# Patient Record
Sex: Female | Born: 1975 | Race: Black or African American | Hispanic: No | Smoking: Never smoker
Health system: Southern US, Community
[De-identification: ages and names within clinical notes are randomized; demographics above are authoritative.]

## PROBLEM LIST (undated history)

## (undated) DIAGNOSIS — O0930 Supervision of pregnancy with insufficient antenatal care, unspecified trimester: Secondary | ICD-10-CM

## (undated) DIAGNOSIS — A599 Trichomoniasis, unspecified: Secondary | ICD-10-CM

## (undated) DIAGNOSIS — D649 Anemia, unspecified: Secondary | ICD-10-CM

## (undated) DIAGNOSIS — O09529 Supervision of elderly multigravida, unspecified trimester: Secondary | ICD-10-CM

## (undated) DIAGNOSIS — A749 Chlamydial infection, unspecified: Secondary | ICD-10-CM

## (undated) DIAGNOSIS — D573 Sickle-cell trait: Secondary | ICD-10-CM

## (undated) HISTORY — PX: WISDOM TOOTH EXTRACTION: SHX21

---

## 1996-05-22 DIAGNOSIS — A599 Trichomoniasis, unspecified: Secondary | ICD-10-CM

## 1996-05-22 DIAGNOSIS — A749 Chlamydial infection, unspecified: Secondary | ICD-10-CM

## 1996-05-22 HISTORY — DX: Chlamydial infection, unspecified: A74.9

## 1996-05-22 HISTORY — DX: Trichomoniasis, unspecified: A59.9

## 2011-01-18 LAB — GC/CHLAMYDIA PROBE AMP, GENITAL: Gonorrhea: NEGATIVE

## 2011-01-18 LAB — RPR: RPR: NONREACTIVE

## 2011-01-18 LAB — ANTIBODY SCREEN: Antibody Screen: NEGATIVE

## 2011-05-09 LAB — STREP B DNA PROBE: GBS: NEGATIVE

## 2011-05-18 ENCOUNTER — Other Ambulatory Visit (HOSPITAL_COMMUNITY): Payer: Self-pay | Admitting: Obstetrics and Gynecology

## 2011-05-18 DIAGNOSIS — O409XX0 Polyhydramnios, unspecified trimester, not applicable or unspecified: Secondary | ICD-10-CM

## 2011-05-19 ENCOUNTER — Other Ambulatory Visit (HOSPITAL_COMMUNITY): Payer: Self-pay | Admitting: Obstetrics and Gynecology

## 2011-05-19 ENCOUNTER — Ambulatory Visit (HOSPITAL_COMMUNITY)
Admission: RE | Admit: 2011-05-19 | Discharge: 2011-05-19 | Disposition: A | Discharge status: Home / Self Care | Payer: Medicaid Other | Source: Ambulatory Visit | Attending: Obstetrics and Gynecology | Admitting: Obstetrics and Gynecology

## 2011-05-19 DIAGNOSIS — Z3689 Encounter for other specified antenatal screening: Secondary | ICD-10-CM | POA: Insufficient documentation

## 2011-05-19 DIAGNOSIS — O409XX0 Polyhydramnios, unspecified trimester, not applicable or unspecified: Secondary | ICD-10-CM

## 2011-05-22 ENCOUNTER — Inpatient Hospital Stay (HOSPITAL_COMMUNITY): Admission: AD | Admit: 2011-05-22 | Payer: Self-pay | Source: Ambulatory Visit | Admitting: Obstetrics and Gynecology

## 2011-06-11 ENCOUNTER — Encounter (HOSPITAL_COMMUNITY): Payer: Self-pay | Admitting: Pharmacist

## 2011-06-12 ENCOUNTER — Inpatient Hospital Stay (HOSPITAL_COMMUNITY)
Admission: RE | Admit: 2011-06-12 | Discharge: 2011-06-16 | DRG: 766 | Disposition: A | Discharge status: Home / Self Care | Payer: Medicaid Other | Source: Ambulatory Visit | Attending: Obstetrics and Gynecology | Admitting: Obstetrics and Gynecology

## 2011-06-12 ENCOUNTER — Encounter (HOSPITAL_COMMUNITY): Payer: Self-pay

## 2011-06-12 DIAGNOSIS — O409XX0 Polyhydramnios, unspecified trimester, not applicable or unspecified: Secondary | ICD-10-CM

## 2011-06-12 DIAGNOSIS — D649 Anemia, unspecified: Secondary | ICD-10-CM | POA: Diagnosis not present

## 2011-06-12 DIAGNOSIS — O0930 Supervision of pregnancy with insufficient antenatal care, unspecified trimester: Secondary | ICD-10-CM

## 2011-06-12 DIAGNOSIS — Z98891 History of uterine scar from previous surgery: Secondary | ICD-10-CM | POA: Diagnosis not present

## 2011-06-12 DIAGNOSIS — D573 Sickle-cell trait: Secondary | ICD-10-CM

## 2011-06-12 DIAGNOSIS — O9903 Anemia complicating the puerperium: Secondary | ICD-10-CM | POA: Diagnosis not present

## 2011-06-12 DIAGNOSIS — O09529 Supervision of elderly multigravida, unspecified trimester: Secondary | ICD-10-CM

## 2011-06-12 DIAGNOSIS — O480 Post-term pregnancy: Principal | ICD-10-CM | POA: Diagnosis present

## 2011-06-12 HISTORY — DX: Chlamydial infection, unspecified: A74.9

## 2011-06-12 HISTORY — DX: Supervision of pregnancy with insufficient antenatal care, unspecified trimester: O09.30

## 2011-06-12 HISTORY — DX: Sickle-cell trait: D57.3

## 2011-06-12 HISTORY — DX: Trichomoniasis, unspecified: A59.9

## 2011-06-12 HISTORY — DX: Anemia, unspecified: D64.9

## 2011-06-12 HISTORY — DX: Supervision of elderly multigravida, unspecified trimester: O09.529

## 2011-06-12 LAB — CBC
MCH: 29.3 pg (ref 26.0–34.0)
MCHC: 34.6 g/dL (ref 30.0–36.0)
RDW: 15.3 % (ref 11.5–15.5)

## 2011-06-12 MED ORDER — LACTATED RINGERS IV SOLN
INTRAVENOUS | Status: DC
  Administered 2011-06-12 – 2011-06-14 (×6): via INTRAVENOUS

## 2011-06-12 MED ORDER — OXYTOCIN BOLUS FROM INFUSION
500.0000 mL | Freq: Once | INTRAVENOUS | Status: DC
  Filled 2011-06-12: qty 500

## 2011-06-12 MED ORDER — LIDOCAINE HCL (PF) 1 % IJ SOLN
30.0000 mL | INTRAMUSCULAR | Status: DC | PRN
  Filled 2011-06-12: qty 30

## 2011-06-12 MED ORDER — ACETAMINOPHEN 325 MG PO TABS: 650.0000 mg | ORAL_TABLET | ORAL | Status: DC | PRN

## 2011-06-12 MED ORDER — ZOLPIDEM TARTRATE 10 MG PO TABS
10.0000 mg | ORAL_TABLET | Freq: Every evening | ORAL | Status: DC | PRN
  Administered 2011-06-13: 10 mg via ORAL
  Filled 2011-06-12: qty 1

## 2011-06-12 MED ORDER — CITRIC ACID-SODIUM CITRATE 334-500 MG/5ML PO SOLN
30.0000 mL | ORAL | Status: DC | PRN
  Filled 2011-06-12: qty 15

## 2011-06-12 MED ORDER — TERBUTALINE SULFATE 1 MG/ML IJ SOLN: 0.2500 mg | Freq: Once | INTRAMUSCULAR | Status: AC | PRN

## 2011-06-12 MED ORDER — DINOPROSTONE 10 MG VA INST
10.0000 mg | VAGINAL_INSERT | Freq: Once | VAGINAL | Status: AC
  Administered 2011-06-12: 10 mg via VAGINAL
  Filled 2011-06-12: qty 1

## 2011-06-12 MED ORDER — ONDANSETRON HCL 4 MG/2ML IJ SOLN
4.0000 mg | Freq: Four times a day (QID) | INTRAMUSCULAR | Status: DC | PRN
  Administered 2011-06-13: 4 mg via INTRAVENOUS
  Filled 2011-06-12: qty 2

## 2011-06-12 MED ORDER — IBUPROFEN 600 MG PO TABS: 600.0000 mg | ORAL_TABLET | Freq: Four times a day (QID) | ORAL | Status: DC | PRN

## 2011-06-12 MED ORDER — LACTATED RINGERS IV SOLN
500.0000 mL | INTRAVENOUS | Status: DC | PRN
  Administered 2011-06-12 – 2011-06-13 (×2): 1000 mL via INTRAVENOUS

## 2011-06-12 MED ORDER — OXYTOCIN 10 UNIT/ML IJ SOLN: 10.0000 [IU] | Freq: Once | INTRAMUSCULAR | Status: DC

## 2011-06-12 MED ORDER — OXYTOCIN 20 UNITS IN LACTATED RINGERS INFUSION - SIMPLE: 125.0000 mL/h | Freq: Once | INTRAVENOUS | Status: DC

## 2011-06-12 MED ORDER — OXYCODONE-ACETAMINOPHEN 5-325 MG PO TABS: 2.0000 | ORAL_TABLET | ORAL | Status: DC | PRN

## 2011-06-12 NOTE — H&P (Signed)
Sherry Walls is a 36 y.o. female presenting for IOL secondary to post-dates. Denies ctx, VB or LOF, reports +FM.  Pregnancy significant for: 1. Polyhydramnios - resolved 2. SGA w AC lag - resolved  3. +SCT 4. 2nd trimester UTI 4. LTC at [facility] 5. AMA 6. Anemia 7. Different FOB 8. Hx STD's  HPI: Pt began prenatal care at [facility] at [facility], had a +urine cx and was Rx'd keflex with TOC nl.  23wks - normal anat scan - growth 18% 27wks - growth 15%, AC 8% BPP 8/8 normal doppler studies 29wks - growth 38%, AC <10% BPP 8/8 normal dopplers 30wks - NST non-reactive, BPP 8/8, AFI >95% 31wks - growth 23% AC 11%, BPP 8/8 34wks - growth 49% BPP 8/8, AFI 95%, normal dopplers 35wks - BPP 8/8, AFI 90%, normal dopplers 37wks - growth 57%, BPP 8/8, AFI normal 38wks - [place] normal 39wks - [place] normal 40wks - BPP 8/8, AFI 65%   Maternal Medical History:  Reason for admission: IOL secondary to postdates  Contractions: denies  Fetal activity: Perceived fetal activity is normal.    Prenatal complications: Polyhydramnios.   Poly - resolved at term Growth followed closely, secondary AC lag, which resolved      OB hx   10/92 - SVD female, full-term,  7#3oz, epidural, no comp 3/95 - SVD female, full-term, 6#12oz, epidural, no comp  History reviewed. No pertinent past medical history. Hx +anemia, +SCT,  No past surgical history on file. wisdom teeth  Family History: family history is not on file. MI - mom CHTN - mom +SCT - mom Diabetes - dad Hypothyroidism - mom Cancer - MGM  Social History:  reports that she has never smoked. She has never used smokeless tobacco. She reports that she does not drink alcohol or use illicit drugs.  Pt is SAA, HS education, has 38yo and 38yo. S.O. Lives in [place]. 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  Review of Systems  All other systems reviewed and are negative.    Dilation: 1 Effacement (%): 50 Station: -1 Exam by:: S. Javan Gonzaga, CNM Blood pressure 116/79,  pulse 100, resp. rate 18, height 5\' 1"  (1.549 m), weight 72.122 kg (159 lb), last menstrual period 08/29/2010. Maternal Exam:  Uterine Assessment: Contraction strength is mild.  Contraction frequency is rare.   Abdomen: Fundal height is aga.   Estimated fetal weight is 7-11.   Fetal presentation: vertex  Introitus: Normal vulva. Normal vagina.  Vagina is negative for discharge.  Pelvis: adequate for delivery.   Cervix: Cervix evaluated by digital exam.     Fetal Exam Fetal Monitor Review: Mode: ultrasound.   Baseline rate: 140.  Variability: moderate (6-25 bpm).   Pattern: accelerations present and no decelerations.    Fetal State Assessment: Category I - tracings are normal.     Physical Exam  Nursing note and vitals reviewed. Constitutional: She is oriented to person, place, and time. She appears well-developed and well-nourished.  HENT:  Head: Normocephalic.  Neck: Normal range of motion.  Cardiovascular: Normal rate, regular rhythm and normal heart sounds.   Respiratory: Effort normal and breath sounds normal.  GI: Soft. Bowel sounds are normal. There is no tenderness.  Genitourinary: Vagina normal. No vaginal discharge found.       Cx=1/50/-1  Musculoskeletal: Normal range of motion. She exhibits no edema and no tenderness.  Neurological: She is alert and oriented to person, place, and time. She has normal reflexes.  Skin: Skin is warm and dry.  Psychiatric: She has a normal mood and affect. Her behavior is normal.    Prenatal labs: ABO, Rh: B/Positive/-- (08/29 0000) Antibody: Negative (08/29 0000) Rubella: Immune (08/29 0000) RPR: Nonreactive (08/29 0000)  HBsAg: Negative (08/29 0000)  HIV: Non-reactive (08/29 0000)  GBS:   neg 1hr gtt 129 - hgb 10.1 - RPR - NR Pap/GC/CT - neg Urine cx + klebsiella - tx'd   Assessment/Plan: IUP at 41wks GBS neg  Admit to birthing suites - Dr Su Hilt attending - CNM care Routine CNM orders Cervidil overnight  Pitocin  in AM Epidural PRN   Therasa Lorenzi M 06/12/2011, 8:40 PM

## 2011-06-13 LAB — RPR: RPR Ser Ql: NONREACTIVE

## 2011-06-13 MED ORDER — OXYTOCIN 20 UNITS IN LACTATED RINGERS INFUSION - SIMPLE
1.0000 m[IU]/min | INTRAVENOUS | Status: DC
  Administered 2011-06-13: 2 m[IU]/min via INTRAVENOUS
  Administered 2011-06-14: 3 m[IU]/min via INTRAVENOUS
  Filled 2011-06-13: qty 1000

## 2011-06-13 MED ORDER — TERBUTALINE SULFATE 1 MG/ML IJ SOLN: 0.2500 mg | Freq: Once | INTRAMUSCULAR | Status: AC | PRN

## 2011-06-13 MED ORDER — EPHEDRINE 5 MG/ML INJ: 10.0000 mg | INTRAVENOUS | Status: DC | PRN

## 2011-06-13 MED ORDER — EPHEDRINE 5 MG/ML INJ
10.0000 mg | INTRAVENOUS | Status: DC | PRN
  Filled 2011-06-13: qty 4

## 2011-06-13 MED ORDER — FENTANYL 2.5 MCG/ML BUPIVACAINE 1/10 % EPIDURAL INFUSION (WH - ANES)
14.0000 mL/h | INTRAMUSCULAR | Status: DC
  Administered 2011-06-13 – 2011-06-14 (×4): 14 mL/h via EPIDURAL
  Filled 2011-06-13 (×5): qty 60

## 2011-06-13 MED ORDER — PHENYLEPHRINE 40 MCG/ML (10ML) SYRINGE FOR IV PUSH (FOR BLOOD PRESSURE SUPPORT)
80.0000 ug | PREFILLED_SYRINGE | INTRAVENOUS | Status: DC | PRN
  Filled 2011-06-13: qty 5

## 2011-06-13 MED ORDER — PHENYLEPHRINE 40 MCG/ML (10ML) SYRINGE FOR IV PUSH (FOR BLOOD PRESSURE SUPPORT): 80.0000 ug | PREFILLED_SYRINGE | INTRAVENOUS | Status: DC | PRN

## 2011-06-13 MED ORDER — CALCIUM CARBONATE ANTACID 500 MG PO CHEW
400.0000 mg | CHEWABLE_TABLET | Freq: Four times a day (QID) | ORAL | Status: DC | PRN
  Administered 2011-06-13 (×2): 400 mg via ORAL
  Filled 2011-06-13: qty 2
  Filled 2011-06-13 (×2): qty 1

## 2011-06-13 MED ORDER — LIDOCAINE HCL 1.5 % IJ SOLN
INTRAMUSCULAR | Status: DC | PRN
  Administered 2011-06-13 (×2): 5 mL via EPIDURAL

## 2011-06-13 MED ORDER — DIPHENHYDRAMINE HCL 50 MG/ML IJ SOLN: 12.5000 mg | INTRAMUSCULAR | Status: DC | PRN

## 2011-06-13 MED ORDER — LACTATED RINGERS IV SOLN: 500.0000 mL | Freq: Once | INTRAVENOUS | Status: DC

## 2011-06-13 NOTE — Progress Notes (Signed)
Subjective: Call by pt's RN around 1520 secondary to FHT having "lates."  Pt received epidural just after 1415, and is comfortable now s/p procedure.  Late decels started around 1430, and despite repositioning, oxygen, IVF bolus, unable to resolve until Pitocin d/c'd.  Pitocin d/c'd by RN at 1515, and when arrived at bedside around 1535, decels had resolved and FHT nicely reactive.    Objective: BP 104/58  Pulse 100  Temp(Src) 98.1 F (36.7 C) (Oral)  Resp 18  Ht 5\' 1"  (1.549 m)  Wt 72.122 kg (159 lb)  BMI 30.04 kg/m2  SpO2 100%  LMP 08/29/2010      FHT:  FHR: 140 bpm, variability: moderate,  accelerations:  Present,  decelerations:  Present Variables w/ late component and traditional lates for approximately 40 minutes just following epidural placement UC:   regular, every 2-5 minutes SVE:   Dilation: 2 Effacement (%): 70 Station: -2 Exam by:: Brandin Dilday,cnm Slightly oblique position, but vtx when [place]; membranes starting to bulge Labs: Lab Results  Component Value Date   WBC 12.1* 06/12/2011   HGB 11.0* 06/12/2011   HCT 31.8* 06/12/2011   MCV 84.6 06/12/2011   PLT 231 06/12/2011   Assessment / Plan: 1.  IOL at 41.1  2.  GBS neg  3.  s/p epidural w/ run of lates, now resolved  Labor: Progressing normally and Progressing on Pitocin, will continue to increase then AROM Preeclampsia:  no signs or symptoms of toxicity Fetal Wellbeing:  Category II Pain Control:  Epidural I/D:  n/a Anticipated MOD:  NSVD Will restart Pitocin on 1mu at 1600 and continue to observe closely.   C/w MD prn.  Judine Arciniega H 06/13/2011, 3:43 PM

## 2011-06-13 NOTE — Progress Notes (Signed)
Subjective: Pt sleeping soundly when entered room and significant other at bedside asleep. Pt reports Ambien helped her sleep well.  Some cramping overnight.  No VB or LOF.  GFM.  No breakfast this AM.  Objective: BP 117/75  Pulse 101  Temp(Src) 97.8 F (36.6 C) (Oral)  Resp 18  Ht 5\' 1"  (1.549 m)  Wt 72.122 kg (159 lb)  BMI 30.04 kg/m2  LMP 08/29/2010      FHT:  FHR: 140 bpm, variability: moderate,  accelerations:  Present,  decelerations:  Absent UC:   irreg w/ some UI SVE:   Dilation: 1.5 Effacement (%): 60 Station: -2 Exam by:: Tyray Proch, CNM (Cervidil removed)  Labs: Lab Results  Component Value Date   WBC 12.1* 06/12/2011   HGB 11.0* 06/12/2011   HCT 31.8* 06/12/2011   MCV 84.6 06/12/2011   PLT 231 06/12/2011    Assessment / Plan: 1.  Induction secondary to post term  2.  GBS neg  3.  Cervidil just removed  Labor: Will start Pitocin now Preeclampsia:  no signs or symptoms of toxicity Fetal Wellbeing:  Category I Pain Control:  Desires epidural for pain when in labor I/D:  n/a Anticipated MOD:  NSVD C/w MD prn. Wana Mount H 06/13/2011, 9:33 AM

## 2011-06-13 NOTE — Progress Notes (Signed)
Patient ID: Sherry Walls, female   DOB: 1975-11-28, 36 y.o.   MRN: [id number] .Subjective:  Comfortable with epidural  Objective: BP 96/61  Pulse 98  Temp(Src) 98.5 F (36.9 C) (Oral)  Resp 18  Ht 5\' 1"  (1.549 m)  Wt 72.122 kg (159 lb)  BMI 30.04 kg/m2  SpO2 100%  LMP 08/29/2010   FHT:  FHR: 130 bpm, variability: moderate,  accelerations:  Present,  decelerations:  Absent  UC:   regular, every 2-3 minutes SVE:   Dilation: 2 Effacement (%): 70 Station: -2 Exam by:: Shelly  AROM for light stained mec cx now 4cm, IUPC placed without difficulty Will augment with pit if necessary  Assessment / Plan: Induction of labor due to postdates,  progressing well on pitocin GBS neg   Fetal Wellbeing:  Category I Pain Control:  Epidural  Dr Normand Sloop updated  [name] 06/13/2011, 9:19 PM

## 2011-06-13 NOTE — Anesthesia Procedure Notes (Signed)
Epidural Patient location during procedure: OB Start time: 06/13/2011 2:14 PM  Staffing Anesthesiologist: Brayton Caves R Performed by: anesthesiologist   Preanesthetic Checklist Completed: patient identified, site marked, surgical consent, pre-op evaluation, timeout performed, IV checked, risks and benefits discussed and monitors and equipment checked  Epidural Patient position: sitting Prep: site prepped and draped and DuraPrep Patient monitoring: continuous pulse ox and blood pressure Approach: midline Injection technique: LOR air and LOR saline  Needle:  Needle type: Tuohy  Needle gauge: 17 G Needle length: 9 cm Needle insertion depth: 5 cm cm Catheter type: closed end flexible Catheter size: 19 Gauge Catheter at skin depth: 10 cm Test dose: negative  Assessment Events: blood not aspirated, injection not painful, no injection resistance, negative IV test and no paresthesia  Additional Notes Patient identified.  Risk benefits discussed including failed block, incomplete pain control, headache, nerve damage, paralysis, blood pressure changes, nausea, vomiting, reactions to medication both toxic or allergic, and postpartum back pain.  Patient expressed understanding and wished to proceed.  All questions were answered.  Sterile technique used throughout procedure and epidural site dressed with sterile barrier dressing. No paresthesia or other complications noted.The patient did not experience any signs of intravascular injection such as tinnitus or metallic taste in mouth nor signs of intrathecal spread such as rapid motor block. Please see nursing notes for vital signs.

## 2011-06-13 NOTE — Anesthesia Preprocedure Evaluation (Addendum)

## 2011-06-14 ENCOUNTER — Encounter (HOSPITAL_COMMUNITY): Payer: Self-pay | Admitting: Anesthesiology

## 2011-06-14 ENCOUNTER — Encounter (HOSPITAL_COMMUNITY)
Admission: RE | Disposition: A | Discharge status: Home / Self Care | Payer: Self-pay | Source: Ambulatory Visit | Attending: Obstetrics and Gynecology

## 2011-06-14 ENCOUNTER — Encounter (HOSPITAL_COMMUNITY): Payer: Self-pay

## 2011-06-14 ENCOUNTER — Other Ambulatory Visit: Payer: Self-pay | Admitting: Obstetrics and Gynecology

## 2011-06-14 ENCOUNTER — Inpatient Hospital Stay (HOSPITAL_COMMUNITY): Payer: Medicaid Other | Admitting: Anesthesiology

## 2011-06-14 SURGERY — Surgical Case
Anesthesia: Epidural | Site: Abdomen | Wound class: Clean Contaminated

## 2011-06-14 MED ORDER — CEFAZOLIN SODIUM 1-5 GM-% IV SOLN
INTRAVENOUS | Status: AC
  Filled 2011-06-14: qty 50

## 2011-06-14 MED ORDER — ONDANSETRON HCL 4 MG/2ML IJ SOLN
INTRAMUSCULAR | Status: DC | PRN
  Administered 2011-06-14: 4 mg via INTRAVENOUS

## 2011-06-14 MED ORDER — FENTANYL CITRATE 0.05 MG/ML IJ SOLN
INTRAMUSCULAR | Status: DC | PRN
  Administered 2011-06-14: 100 ug via INTRAVENOUS

## 2011-06-14 MED ORDER — HYDROMORPHONE HCL PF 1 MG/ML IJ SOLN: 0.2500 mg | INTRAMUSCULAR | Status: DC | PRN

## 2011-06-14 MED ORDER — SIMETHICONE 80 MG PO CHEW: 80.0000 mg | CHEWABLE_TABLET | ORAL | Status: DC | PRN

## 2011-06-14 MED ORDER — MORPHINE SULFATE (PF) 0.5 MG/ML IJ SOLN
INTRAMUSCULAR | Status: DC | PRN
  Administered 2011-06-14: 4 mg via EPIDURAL

## 2011-06-14 MED ORDER — BISACODYL 10 MG RE SUPP: 10.0000 mg | Freq: Every day | RECTAL | Status: DC | PRN

## 2011-06-14 MED ORDER — TETANUS-DIPHTH-ACELL PERTUSSIS 5-2.5-18.5 LF-MCG/0.5 IM SUSP: 0.5000 mL | Freq: Once | INTRAMUSCULAR | Status: DC

## 2011-06-14 MED ORDER — PROMETHAZINE HCL 25 MG/ML IJ SOLN: 6.2500 mg | INTRAMUSCULAR | Status: DC | PRN

## 2011-06-14 MED ORDER — SENNOSIDES-DOCUSATE SODIUM 8.6-50 MG PO TABS
2.0000 | ORAL_TABLET | Freq: Every day | ORAL | Status: DC
  Administered 2011-06-14 – 2011-06-15 (×2): 2 via ORAL

## 2011-06-14 MED ORDER — KETOROLAC TROMETHAMINE 30 MG/ML IJ SOLN: 15.0000 mg | Freq: Once | INTRAMUSCULAR | Status: DC | PRN

## 2011-06-14 MED ORDER — DIPHENHYDRAMINE HCL 50 MG/ML IJ SOLN: 12.5000 mg | INTRAMUSCULAR | Status: DC | PRN

## 2011-06-14 MED ORDER — OXYTOCIN 20 UNITS IN LACTATED RINGERS INFUSION - SIMPLE: 125.0000 mL/h | INTRAVENOUS | Status: AC

## 2011-06-14 MED ORDER — DIBUCAINE 1 % RE OINT: 1.0000 "application " | TOPICAL_OINTMENT | RECTAL | Status: DC | PRN

## 2011-06-14 MED ORDER — MENTHOL 3 MG MT LOZG: 1.0000 | LOZENGE | OROMUCOSAL | Status: DC | PRN

## 2011-06-14 MED ORDER — LACTATED RINGERS IV SOLN
INTRAVENOUS | Status: DC | PRN
  Administered 2011-06-14 (×3): via INTRAVENOUS

## 2011-06-14 MED ORDER — LACTATED RINGERS IV SOLN: INTRAVENOUS | Status: DC

## 2011-06-14 MED ORDER — MORPHINE SULFATE (PF) 0.5 MG/ML IJ SOLN
INTRAMUSCULAR | Status: DC | PRN
  Administered 2011-06-14: 1 mg via INTRAVENOUS

## 2011-06-14 MED ORDER — ONDANSETRON HCL 4 MG/2ML IJ SOLN: 4.0000 mg | INTRAMUSCULAR | Status: DC | PRN

## 2011-06-14 MED ORDER — FENTANYL CITRATE 0.05 MG/ML IJ SOLN
INTRAMUSCULAR | Status: AC
  Filled 2011-06-14: qty 2

## 2011-06-14 MED ORDER — PHENYLEPHRINE 40 MCG/ML (10ML) SYRINGE FOR IV PUSH (FOR BLOOD PRESSURE SUPPORT)
PREFILLED_SYRINGE | INTRAVENOUS | Status: AC
  Filled 2011-06-14: qty 10

## 2011-06-14 MED ORDER — ZOLPIDEM TARTRATE 5 MG PO TABS: 5.0000 mg | ORAL_TABLET | Freq: Every evening | ORAL | Status: DC | PRN

## 2011-06-14 MED ORDER — MEPERIDINE HCL 25 MG/ML IJ SOLN: 6.2500 mg | INTRAMUSCULAR | Status: DC | PRN

## 2011-06-14 MED ORDER — SCOPOLAMINE 1 MG/3DAYS TD PT72
MEDICATED_PATCH | TRANSDERMAL | Status: AC
  Administered 2011-06-14: 1.5 mg via TRANSDERMAL
  Filled 2011-06-14: qty 1

## 2011-06-14 MED ORDER — DIPHENHYDRAMINE HCL 50 MG/ML IJ SOLN: 25.0000 mg | INTRAMUSCULAR | Status: DC | PRN

## 2011-06-14 MED ORDER — ONDANSETRON HCL 4 MG/2ML IJ SOLN: 4.0000 mg | Freq: Three times a day (TID) | INTRAMUSCULAR | Status: DC | PRN

## 2011-06-14 MED ORDER — OXYTOCIN 10 UNIT/ML IJ SOLN
20.0000 [IU] | INTRAVENOUS | Status: DC | PRN
  Administered 2011-06-14: 20 [IU] via INTRAVENOUS

## 2011-06-14 MED ORDER — IBUPROFEN 600 MG PO TABS
600.0000 mg | ORAL_TABLET | Freq: Four times a day (QID) | ORAL | Status: DC
  Administered 2011-06-14 – 2011-06-16 (×8): 600 mg via ORAL
  Filled 2011-06-14 (×4): qty 1
  Filled 2011-06-14: qty 2
  Filled 2011-06-14 (×2): qty 1

## 2011-06-14 MED ORDER — SODIUM CHLORIDE 0.9 % IV SOLN: 1.0000 ug/kg/h | INTRAVENOUS | Status: DC | PRN

## 2011-06-14 MED ORDER — KETOROLAC TROMETHAMINE 60 MG/2ML IM SOLN
60.0000 mg | Freq: Once | INTRAMUSCULAR | Status: AC | PRN
  Administered 2011-06-14: 60 mg via INTRAMUSCULAR

## 2011-06-14 MED ORDER — CEFAZOLIN SODIUM-DEXTROSE 1-4 GM-% IV SOLR
1.0000 g | Freq: Once | INTRAVENOUS | Status: DC
  Filled 2011-06-14: qty 50

## 2011-06-14 MED ORDER — ONDANSETRON HCL 4 MG PO TABS: 4.0000 mg | ORAL_TABLET | ORAL | Status: DC | PRN

## 2011-06-14 MED ORDER — LIDOCAINE-EPINEPHRINE (PF) 2 %-1:200000 IJ SOLN
INTRAMUSCULAR | Status: AC
  Filled 2011-06-14: qty 20

## 2011-06-14 MED ORDER — NALBUPHINE HCL 10 MG/ML IJ SOLN
5.0000 mg | INTRAMUSCULAR | Status: DC | PRN
  Filled 2011-06-14: qty 1

## 2011-06-14 MED ORDER — SCOPOLAMINE 1 MG/3DAYS TD PT72
1.0000 | MEDICATED_PATCH | Freq: Once | TRANSDERMAL | Status: DC
  Administered 2011-06-14: 1.5 mg via TRANSDERMAL

## 2011-06-14 MED ORDER — FLEET ENEMA 7-19 GM/118ML RE ENEM: 1.0000 | ENEMA | Freq: Every day | RECTAL | Status: DC | PRN

## 2011-06-14 MED ORDER — SODIUM BICARBONATE 8.4 % IV SOLN
INTRAVENOUS | Status: DC | PRN
  Administered 2011-06-14: 5 mL via EPIDURAL

## 2011-06-14 MED ORDER — MEASLES, MUMPS & RUBELLA VAC ~~LOC~~ INJ: 0.5000 mL | INJECTION | Freq: Once | SUBCUTANEOUS | Status: DC

## 2011-06-14 MED ORDER — LIDOCAINE-EPINEPHRINE (PF) 2 %-1:200000 IJ SOLN
INTRAMUSCULAR | Status: DC | PRN
  Administered 2011-06-14 (×2): 5 mL

## 2011-06-14 MED ORDER — LANOLIN HYDROUS EX OINT: 1.0000 "application " | TOPICAL_OINTMENT | CUTANEOUS | Status: DC | PRN

## 2011-06-14 MED ORDER — MEPERIDINE HCL 25 MG/ML IJ SOLN
INTRAMUSCULAR | Status: DC | PRN
  Administered 2011-06-14: 25 mg via INTRAVENOUS

## 2011-06-14 MED ORDER — SIMETHICONE 80 MG PO CHEW
80.0000 mg | CHEWABLE_TABLET | Freq: Three times a day (TID) | ORAL | Status: DC
  Administered 2011-06-14 – 2011-06-16 (×7): 80 mg via ORAL

## 2011-06-14 MED ORDER — DIPHENHYDRAMINE HCL 25 MG PO CAPS: 25.0000 mg | ORAL_CAPSULE | Freq: Four times a day (QID) | ORAL | Status: DC | PRN

## 2011-06-14 MED ORDER — KETOROLAC TROMETHAMINE 30 MG/ML IJ SOLN: 30.0000 mg | Freq: Four times a day (QID) | INTRAMUSCULAR | Status: AC | PRN

## 2011-06-14 MED ORDER — WITCH HAZEL-GLYCERIN EX PADS: 1.0000 "application " | MEDICATED_PAD | CUTANEOUS | Status: DC | PRN

## 2011-06-14 MED ORDER — CEFAZOLIN SODIUM 1-5 GM-% IV SOLN
INTRAVENOUS | Status: DC | PRN
  Administered 2011-06-14: 1 g via INTRAVENOUS

## 2011-06-14 MED ORDER — OXYTOCIN 10 UNIT/ML IJ SOLN
INTRAMUSCULAR | Status: AC
  Filled 2011-06-14: qty 4

## 2011-06-14 MED ORDER — ONDANSETRON HCL 4 MG/2ML IJ SOLN
INTRAMUSCULAR | Status: AC
  Filled 2011-06-14: qty 2

## 2011-06-14 MED ORDER — SODIUM CHLORIDE 0.9 % IJ SOLN: 3.0000 mL | INTRAMUSCULAR | Status: DC | PRN

## 2011-06-14 MED ORDER — STERILE WATER FOR IRRIGATION IR SOLN
Status: DC | PRN
  Administered 2011-06-14: 07:00:00 via INTRAVESICAL

## 2011-06-14 MED ORDER — FERROUS SULFATE 325 (65 FE) MG PO TABS
325.0000 mg | ORAL_TABLET | Freq: Two times a day (BID) | ORAL | Status: DC
  Administered 2011-06-14 – 2011-06-16 (×4): 325 mg via ORAL
  Filled 2011-06-14 (×4): qty 1

## 2011-06-14 MED ORDER — KETOROLAC TROMETHAMINE 60 MG/2ML IM SOLN
INTRAMUSCULAR | Status: AC
  Administered 2011-06-14: 60 mg via INTRAMUSCULAR
  Filled 2011-06-14: qty 2

## 2011-06-14 MED ORDER — MORPHINE SULFATE 0.5 MG/ML IJ SOLN
INTRAMUSCULAR | Status: AC
  Filled 2011-06-14: qty 10

## 2011-06-14 MED ORDER — NALBUPHINE HCL 10 MG/ML IJ SOLN
5.0000 mg | INTRAMUSCULAR | Status: DC | PRN
  Administered 2011-06-14: 10 mg via INTRAVENOUS

## 2011-06-14 MED ORDER — 0.9 % SODIUM CHLORIDE (POUR BTL) OPTIME
TOPICAL | Status: DC | PRN
  Administered 2011-06-14: 1000 mL

## 2011-06-14 MED ORDER — MEPERIDINE HCL 25 MG/ML IJ SOLN
INTRAMUSCULAR | Status: AC
  Filled 2011-06-14: qty 1

## 2011-06-14 MED ORDER — PRENATAL MULTIVITAMIN CH
1.0000 | ORAL_TABLET | Freq: Every day | ORAL | Status: DC
  Administered 2011-06-15 – 2011-06-16 (×2): 1 via ORAL
  Filled 2011-06-14 (×2): qty 1

## 2011-06-14 MED ORDER — IBUPROFEN 600 MG PO TABS: 600.0000 mg | ORAL_TABLET | Freq: Four times a day (QID) | ORAL | Status: DC | PRN

## 2011-06-14 MED ORDER — SODIUM BICARBONATE 8.4 % IV SOLN
INTRAVENOUS | Status: AC
  Filled 2011-06-14: qty 50

## 2011-06-14 MED ORDER — DIPHENHYDRAMINE HCL 25 MG PO CAPS: 25.0000 mg | ORAL_CAPSULE | ORAL | Status: DC | PRN

## 2011-06-14 MED ORDER — NALOXONE HCL 0.4 MG/ML IJ SOLN: 0.4000 mg | INTRAMUSCULAR | Status: DC | PRN

## 2011-06-14 SURGICAL SUPPLY — 39 items
BENZOIN TINCTURE PRP APPL 2/3 (GAUZE/BANDAGES/DRESSINGS) ×2 IMPLANT
CHLORAPREP W/TINT 26ML (MISCELLANEOUS) ×2 IMPLANT
CLOTH BEACON ORANGE TIMEOUT ST (SAFETY) ×2 IMPLANT
CONTAINER PREFILL 10% NBF 15ML (MISCELLANEOUS) IMPLANT
DRAIN JACKSON PRT FLT 10 (DRAIN) IMPLANT
DRSG COVADERM 4X10 (GAUZE/BANDAGES/DRESSINGS) ×2 IMPLANT
ELECT REM PT RETURN 9FT ADLT (ELECTROSURGICAL) ×2
ELECTRODE REM PT RTRN 9FT ADLT (ELECTROSURGICAL) ×1 IMPLANT
EVACUATOR SILICONE 100CC (DRAIN) IMPLANT
EXTRACTOR VACUUM M CUP 4 TUBE (SUCTIONS) IMPLANT
GLOVE BIO SURGEON STRL SZ 6.5 (GLOVE) ×4 IMPLANT
GLOVE BIOGEL PI IND STRL 7.0 (GLOVE) ×1 IMPLANT
GLOVE BIOGEL PI INDICATOR 7.0 (GLOVE) ×1
GLOVE SKINSENSE NS SZ7.5 (GLOVE) ×2
GLOVE SKINSENSE NS SZ8.0 LF (GLOVE) ×2
GLOVE SKINSENSE STRL SZ7.5 (GLOVE) ×2 IMPLANT
GLOVE SKINSENSE STRL SZ8.0 LF (GLOVE) ×2 IMPLANT
GOWN PREVENTION PLUS LG XLONG (DISPOSABLE) ×6 IMPLANT
KIT ABG SYR 3ML LUER SLIP (SYRINGE) IMPLANT
NEEDLE HYPO 25X5/8 SAFETYGLIDE (NEEDLE) IMPLANT
NS IRRIG 1000ML POUR BTL (IV SOLUTION) ×2 IMPLANT
PACK C SECTION WH (CUSTOM PROCEDURE TRAY) ×2 IMPLANT
RTRCTR C-SECT PINK 25CM LRG (MISCELLANEOUS) ×2 IMPLANT
SLEEVE SCD COMPRESS KNEE MED (MISCELLANEOUS) ×2 IMPLANT
STAPLER VISISTAT 35W (STAPLE) IMPLANT
STRIP CLOSURE SKIN 1/2X4 (GAUZE/BANDAGES/DRESSINGS) ×2 IMPLANT
SUT CHROMIC 0 CT 1 (SUTURE) ×2 IMPLANT
SUT MNCRL AB 3-0 PS2 27 (SUTURE) ×2 IMPLANT
SUT PLAIN 2 0 (SUTURE) ×1
SUT PLAIN 2 0 XLH (SUTURE) ×2 IMPLANT
SUT PLAIN ABS 2-0 CT1 27XMFL (SUTURE) ×1 IMPLANT
SUT SILK 2 0 SH (SUTURE) IMPLANT
SUT VIC AB 0 CTX 36 (SUTURE) ×5
SUT VIC AB 0 CTX36XBRD ANBCTRL (SUTURE) ×5 IMPLANT
SUT VIC AB 2-0 SH 27 (SUTURE) ×1
SUT VIC AB 2-0 SH 27XBRD (SUTURE) ×1 IMPLANT
TOWEL OR 17X24 6PK STRL BLUE (TOWEL DISPOSABLE) ×4 IMPLANT
TRAY FOLEY CATH 14FR (SET/KITS/TRAYS/PACK) IMPLANT
WATER STERILE IRR 1000ML POUR (IV SOLUTION) IMPLANT

## 2011-06-14 NOTE — Progress Notes (Signed)
Patient ID: Sherry Walls, female   DOB: 07-Nov-1975, 36 y.o.   MRN: [id number] Sherry Walls   Subjective:pt without complaint   Objective: BP 111/80  Pulse 105  Temp(Src) 98.4 F (36.9 C) (Oral)  Resp 18  Ht 5\' 1"  (1.549 m)  Wt 72.122 kg (159 lb)  BMI 30.04 kg/m2  SpO2 100%  LMP 08/29/2010   Total I/O In: -  Out: 800 [Urine:800]blood tinged urine  FHT:  FHR: 130-140 bpm, variability: moderate,  accelerations:  Present,  decelerations:  Absent UC:   regular, every 2-4 minutes SVE:   Dilation: 8.5 Effacement (%): 80 Station: -1 Exam by:: Almond Lint, CNM  i also examined the pt.  Agree with exam and the cervix is swollen  Labs: Lab Results  Component Value Date   WBC 12.1* 06/12/2011   HGB 11.0* 06/12/2011   HCT 31.8* 06/12/2011   MCV 84.6 06/12/2011   PLT 231 06/12/2011    Assessment / Plan: Arrest in active phase of labor  Labor: no cervical change after six hours of adequate contractions Fetal Wellbeing:  Category II Anticipated MOD:  will proceed with cesarean.  risks and benefits discussed with patient  Tremel Setters A 06/14/2011, 5:44 AM

## 2011-06-14 NOTE — Transfer of Care (Signed)
Immediate Anesthesia Transfer of Care Note  Patient: Sherry Walls  Procedure(s) Performed:  CESAREAN SECTION - Primary Cesarean Section Delivery Baby Girl @ [phone], Apgars  8/9  Patient Location: PACU  Anesthesia Type: Epidural  Level of Consciousness: awake  Airway & Oxygen Therapy: Patient Spontanous Breathing  Post-op Assessment: Report given to PACU RN  Post vital signs: Reviewed and stable  Complications: No apparent anesthesia complications

## 2011-06-14 NOTE — Progress Notes (Signed)
Patient ID: Sherry Walls, female   DOB: [birth year], 36 y.o.   MRN: [id number] .Subjective:  Resting comfortably  Objective: BP 129/73  Pulse 65  Temp(Src) 98.2 F (36.8 C) (Oral)  Resp 18  Ht 5\' 1"  (1.549 m)  Wt 72.122 kg (159 lb)  BMI 30.04 kg/m2  SpO2 100%  LMP 08/29/2010   FHT:  FHR: 125 bpm, variability: moderate,  accelerations:  Present,  decelerations:  Present occ mild variable UC:   regular, every 2-4 minutes SVE:   Dilation: 8 Effacement (%): 80 Station: -1 Exam by:: Shelly  Pitocin at 3mu mvu's 150-200 Anterior cx slightly swolen, no posterior cx, caput, likely asynclitic   Assessment / Plan: Protracted active phase Will continue with pitocin and position changes, recheck cx in 2 hours If no cervical change in 2 hours, would recommend c/s for FTP   Fetal Wellbeing:  Category I Pain Control:  Epidural  Update physician PRN  [name] 06/14/2011, 2:37 AM

## 2011-06-14 NOTE — Anesthesia Postprocedure Evaluation (Signed)
  Anesthesia Post-op Note  Patient: Sherry Walls  Procedure(s) Performed:  CESAREAN SECTION - Primary Cesarean Section Delivery Baby Girl @ [phone], Apgars  8/9  Patient Location: PACU and Mother/Baby  Anesthesia Type: Epidural  Level of Consciousness: awake, alert  and oriented  Airway and Oxygen Therapy: Patient Spontanous Breathing  Post-op Pain: none  Post-op Assessment: Post-op Vital signs reviewed and Patient's Cardiovascular Status Stable  Post-op Vital Signs: Reviewed and stable  Complications: No apparent anesthesia complications

## 2011-06-14 NOTE — Progress Notes (Signed)
Patient ID: Sherry Walls, female   DOB: Aug 21, 1975, 36 y.o.   MRN: [id number] Cesarean Section Procedure Note   VERGENE MARLAND  06/12/2011 - 06/14/2011  Indications: failed induction, arrest of cervical dilation   Pre-operative Diagnosis: Failure to Progress.   Post-operative Diagnosis: Same   Surgeon: Surgeon(s) and Role:    * Michael Litter, MD - Primary   Assistants: Gevena Barre CNM  Anesthesia: epidural   Procedure Details:  The patient was seen in the Holding Room. The risks, benefits, complications, treatment options, and expected outcomes were discussed with the patient. The patient concurred with the proposed plan, giving informed consent. identified as Sherry Walls and the procedure verified as C-Section Delivery. A Time Out was held and the above information confirmed.  After induction of anesthesia, the patient was draped and prepped in the usual sterile manner. A transverse incision was made and carried down through the subcutaneous tissue to the fascia. Fascial incision was made in the midline and extended transversely. The fascia was separated from the underlying rectus muscle superiorly and inferiorly. The peritoneum was identified and entered. Peritoneal incision was extended longitudinally with good visualization of bowel and bladder. The alexis retractor was placed.  The utero-vesical peritoneal reflection was incised transversely and the bladder flap was bluntly freed from the lower uterine segment. A low transverse uterine incision was made. Delivered from cephalic presentation was a 7-15 pound infant, with Apgar scores of 8 at one minute and 9 at five minutes. Cord ph was not sent the umbilical cord was clamped and cut cord blood was obtained from cord blood donor staff. The placenta was removed Intact and appeared normal. The uterine outline, tubes and ovaries appeared normal}. The uterine incision was closed with running locked sutures of 0Vicryl. A second layer 0  vicrlyl was used to imbricate the uterine incision. Several figure of eight suture was used to obtain hemostasis.    Hemostasis was observed.because the urine was blood tinged sterile milk was used to fill the bladder with 400cc.  No extravasation was seen Lavage was carried out until clear. The fascia was then reapproximated with running sutures of 0 vicryl. The subcutaneous tissue was reaproximated using 2-0 plain.  The subcuticular closure was performed using 3-68monocryl     Instrument, sponge, and needle counts were correct prior the abdominal closure and were correct at the conclusion of the case.    Findings:female infant in vertex presentation.  Normal appearing pelvic and abdominal anatomy   Estimated Blood Loss: 700cc  Total IV Fluids:   Urine Output: 250cc red tinged urine.  started to clear at the end of surgeryCC OF rose colored urine  Specimens: placenta  Complications: no complications  Disposition: PACU - hemodynamically stable.   Maternal Condition: stable   Baby condition / location:  nursery-stable  Attending Attestation: I was present and scrubbed for the entire procedure.   Signed: Surgeon(s): Michael Litter, MD

## 2011-06-14 NOTE — Progress Notes (Signed)
Patient ID: Sherry Walls, female   DOB: May 14, 1976, 36 y.o.   MRN: [id number] .Subjective: Remains comfortable with epidural   Objective: BP 118/80  Pulse 82  Temp(Src) 98.2 F (36.8 C) (Oral)  Resp 18  Ht 5\' 1"  (1.549 m)  Wt 72.122 kg (159 lb)  BMI 30.04 kg/m2  SpO2 100%  LMP 08/29/2010   FHT:  FHR: 130 bpm, variability: moderate,  accelerations:  Present,  decelerations:  Absent UC:   regular, every 2-3 minutes SVE:   Dilation: 8.5 Effacement (%): 80 Station: -1 Exam by:: Almond Lint, CNM    Assessment / Plan: Arrest in active phase of labor  Gave pt option of proceeding w c/s or waiting for 2 more hours to recheck cervix, pt wishes to proceed with c/s. Discussed Risks including infection, bleeding, organ damage or anesthesia complications.   Fetal Wellbeing:  Category I Pain Control:  Epidural  Dr Normand Sloop notified  Sherry Walls 06/14/2011, 5:01 AM

## 2011-06-14 NOTE — Progress Notes (Signed)
UR chart review completed.  

## 2011-06-14 NOTE — Progress Notes (Signed)
Patient ID: Sherlynn Carbon, female   DOB: April 18, 1976, 36 y.o.   MRN: [id number] .Subjective: Feeling pressure, resting   Objective: BP 125/78  Pulse 92  Temp(Src) 98.5 F (36.9 C) (Oral)  Resp 18  Ht 5\' 1"  (1.549 m)  Wt 72.122 kg (159 lb)  BMI 30.04 kg/m2  SpO2 100%  LMP 08/29/2010   FHT:  FHR: 130 bpm, variability: moderate,  accelerations:  Present,  decelerations:  Present late UC:   regular, every 1-2 minutes SVE:   Dilation: 8 Effacement (%): 80 Station: -1 Exam by:: GPayne, RN  cx somewhat swolen with ctx, but 8/100/0 caput Leaking mod stained mec fluid  Assessment / Plan: Induction of labor due to postdates,  progressing well on pitocin GBS neg tachysystole Pitocin off 02 on, position changes Will start amnioinfusion CTO closely   Fetal Wellbeing:  Category II Pain Control:  Epidural  Update physician PRN  Vincent Streater M 06/14/2011, 12:12 AM

## 2011-06-14 NOTE — Addendum Note (Signed)
Addendum  created 06/14/11 1648 by Marrion Coy, CRNA   Modules edited:Notes Section

## 2011-06-14 NOTE — Anesthesia Postprocedure Evaluation (Signed)
Anesthesia Post Note  Patient: Sherry Walls  Procedure(s) Performed:  CESAREAN SECTION - Primary Cesarean Section Delivery Baby Girl @ [phone], Apgars  8/9  Anesthesia type: Epidural  Patient location: PACU  Post pain: Pain level controlled  Post assessment: Post-op Vital signs reviewed  Last Vitals:  Filed Vitals:   06/14/11 0745  BP: 107/88  Pulse: 89  Temp:   Resp: 16    Post vital signs: stable  Level of consciousness: awake  Complications: No apparent anesthesia complications

## 2011-06-15 ENCOUNTER — Encounter: Payer: Self-pay | Admitting: Emergency Medicine

## 2011-06-15 LAB — DIFFERENTIAL
Eosinophils Absolute: 0.1 10*3/uL (ref 0.0–0.7)
Eosinophils Relative: 0 % (ref 0–5)
Lymphs Abs: 2.7 10*3/uL (ref 0.7–4.0)

## 2011-06-15 LAB — CBC
MCH: 29.4 pg (ref 26.0–34.0)
MCV: 85.3 fL (ref 78.0–100.0)
Platelets: 182 10*3/uL (ref 150–400)
RDW: 15.4 % (ref 11.5–15.5)

## 2011-06-15 MED ORDER — OXYCODONE-ACETAMINOPHEN 5-325 MG PO TABS
1.0000 | ORAL_TABLET | ORAL | Status: DC | PRN
  Administered 2011-06-15 – 2011-06-16 (×5): 1 via ORAL
  Filled 2011-06-15 (×5): qty 1

## 2011-06-15 MED ORDER — POLYSACCHARIDE IRON 150 MG PO CAPS: 150.0000 mg | ORAL_CAPSULE | Freq: Every day | ORAL | Status: DC

## 2011-06-15 NOTE — Progress Notes (Signed)
Subjective: Postpartum Day 1: Cesarean Delivery due to FTP Patient reports no problems voiding.  Up ad lib, minimal pain--using Ibuprophen.  Pumping for breastmillk,    Objective: Vital signs in last 24 hours: Temp:  [97.8 F (36.6 C)-98.7 F (37.1 C)] 98.3 F (36.8 C) (01/24 0625) Pulse Rate:  [66-103] 66  (01/24 0625) Resp:  [16-20] 16  (01/24 0625) BP: (87-109)/(46-74) 87/46 mmHg (01/24 0625) SpO2:  [97 %-99 %] 99 % (01/24 0220)  Physical Exam:  General: alert Lochia: appropriate Uterine Fundus: firm Incision: Dressing CDI DVT Evaluation: No evidence of DVT seen on physical exam. Negative Homan's sign.   Basename 06/15/11 0550 06/12/11 2025  HGB 9.4* 11.0*  HCT 27.3* 31.8*    Assessment/Plan: Status post Cesarean section. Doing well postoperatively.  Mild anemia, hemodynamically stable. Continue current care. FeSO4  Hedi Barkan 06/15/2011, 10:11 AM

## 2011-06-16 DIAGNOSIS — Z98891 History of uterine scar from previous surgery: Secondary | ICD-10-CM | POA: Diagnosis not present

## 2011-06-16 MED ORDER — IBUPROFEN 600 MG PO TABS: 600.0000 mg | ORAL_TABLET | Freq: Four times a day (QID) | ORAL | Status: AC | PRN

## 2011-06-16 MED ORDER — FERROUS SULFATE 325 (65 FE) MG PO TABS: 325.0000 mg | ORAL_TABLET | Freq: Every day | ORAL | Status: DC

## 2011-06-16 MED ORDER — OXYCODONE-ACETAMINOPHEN 5-325 MG PO TABS: 1.0000 | ORAL_TABLET | ORAL | Status: AC | PRN

## 2011-06-16 NOTE — Discharge Summary (Signed)
Obstetric Discharge Summary Reason for Admission: induction of labor due to postdates Prenatal Procedures: NST and ultrasound Intrapartum Procedures: cesarean: low cervical, transverse due to FTP Postpartum Procedures: none Complications-Operative and Postpartum: none Hemoglobin  Date Value Range Status  06/15/2011 9.4* 12.0-15.0 (g/dL) Final     HCT  Date Value Range Status  06/15/2011 27.3* 36.0-46.0 (%) Final   Hospital Course: Admitted for induction due to postdates. Negative GBS. Cervidil placed overnight, then pitocin in the am . Progressed to 8 cm, without further progress.  Delivery was performed by  Dr. Normand Sloop without complication. Patient and baby tolerated the procedure without difficulty. Infant to FTN. Mother and infant then had an uncomplicated postpartum course, with breast feeding going well. Mom's physical exam was WNL, and she was discharged home in stable condition per her request on day 2.  Contraception plan was likely Nexplanon.  She received adequate benefit from po pain medications, and was sent home with Ibuprophen and Percocet.   Discharge Diagnoses: Post-date pregnancy, Cesarean delivery due to FTP  Discharge Information: Date: 06/16/2011 Activity: Per [facility] handout Diet: routine Medications: Ibuprofen, Iron and Percocet Condition: stable Instructions: refer to practice specific booklet Discharge to: home Contraception:  Considering nexplanon Follow-up Information    Follow up with [facility] in 6 weeks.         Newborn Data: Live born female  Birth Weight: 7 lb 15.3 oz (3610 g) APGAR: 8, 9  Home with mother.  Nigel Bridgeman 06/16/2011, 10:42 AM

## 2011-07-26 ENCOUNTER — Ambulatory Visit (INDEPENDENT_AMBULATORY_CARE_PROVIDER_SITE_OTHER): Payer: Medicaid Other | Admitting: Obstetrics and Gynecology

## 2013-08-21 IMAGING — US US FETAL BPP W/O NONSTRESS
1 series · 12 of 28 positions shown · non-contrast
Comparison: none

[Series 1: us ob comp +14 wk · 43 acquisitions, 12 frames shown]
[im 2/43]
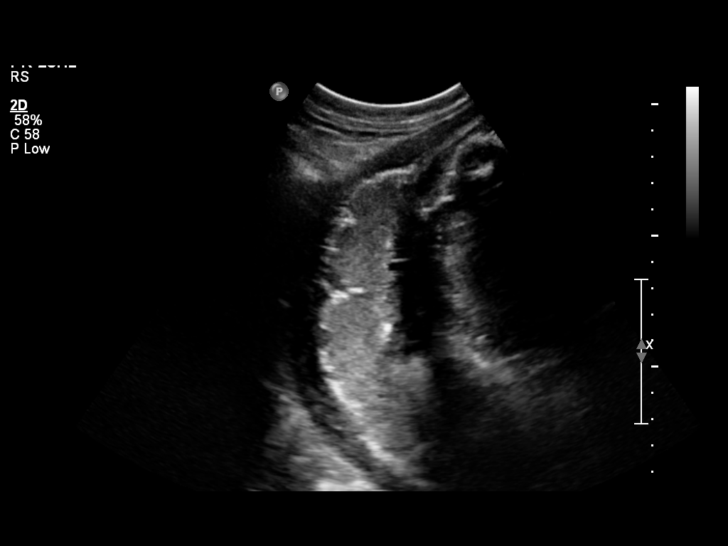
[im 5/43]
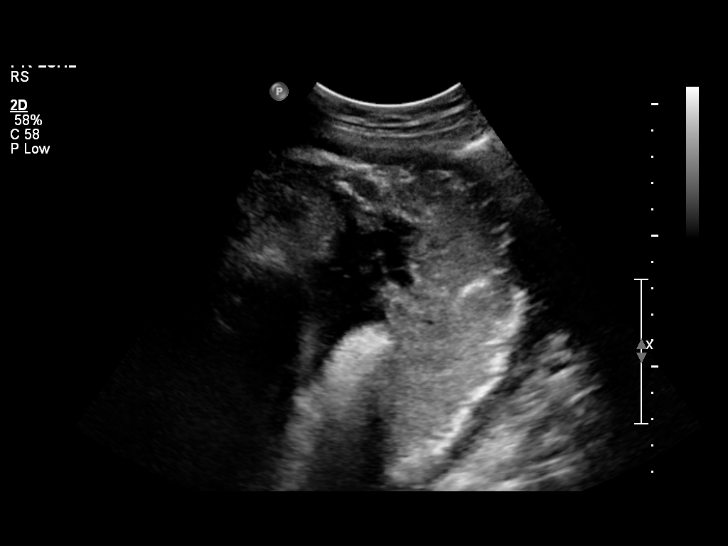
[im 8/43]
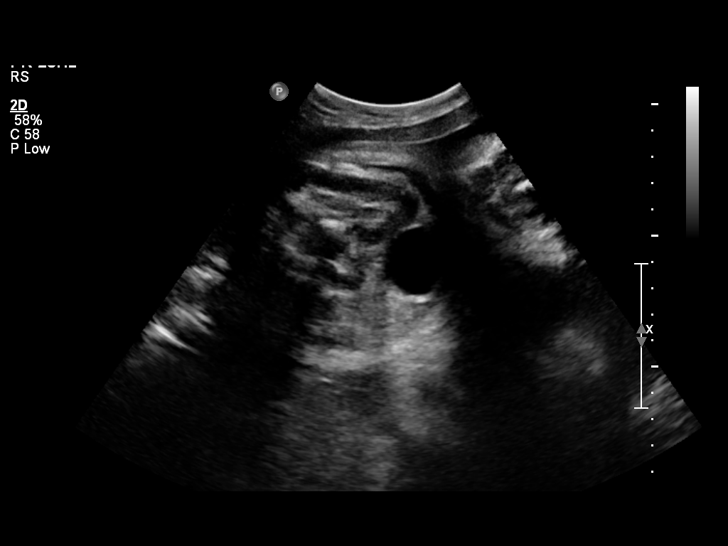
[im 13/43]
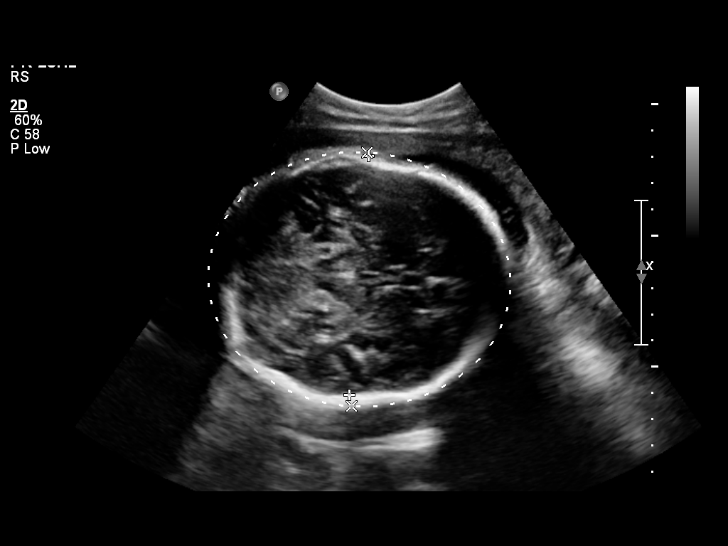
[im 16/43]
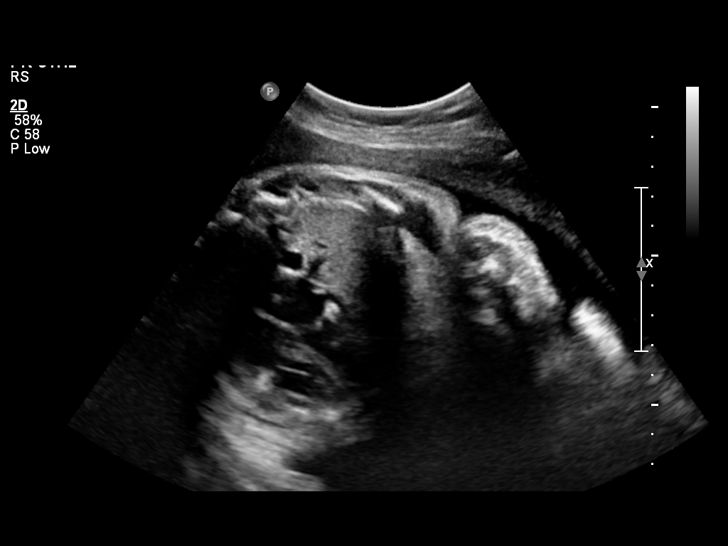
[im 19/43]
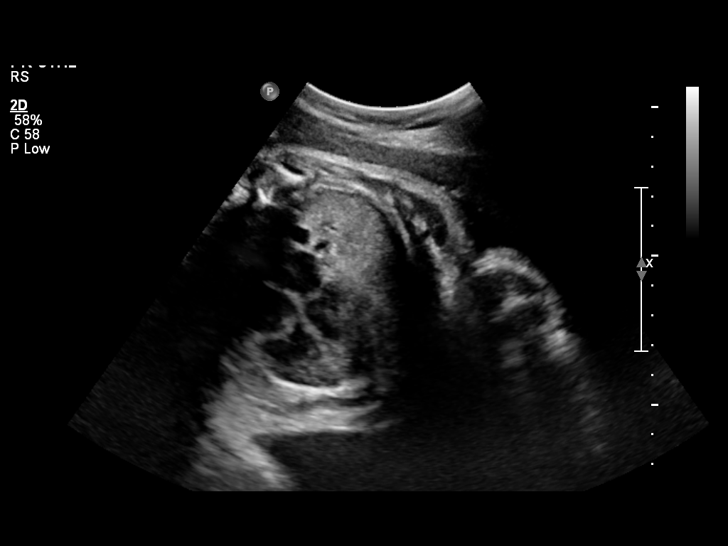
[im 24/43]
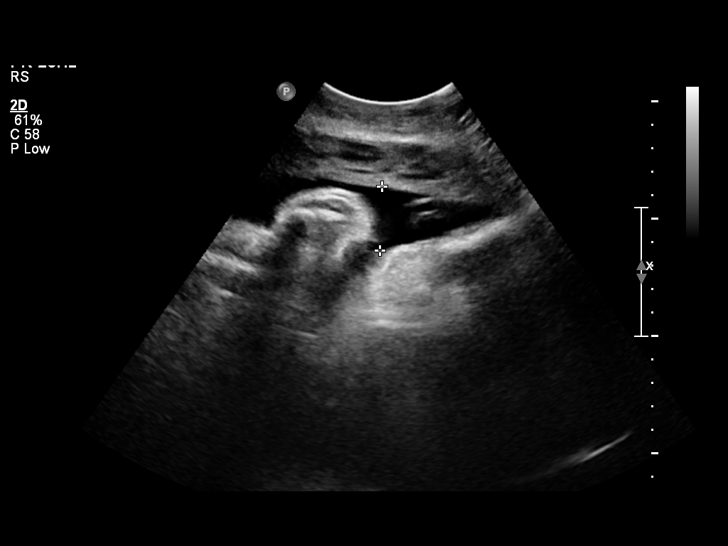
[im 27/43]
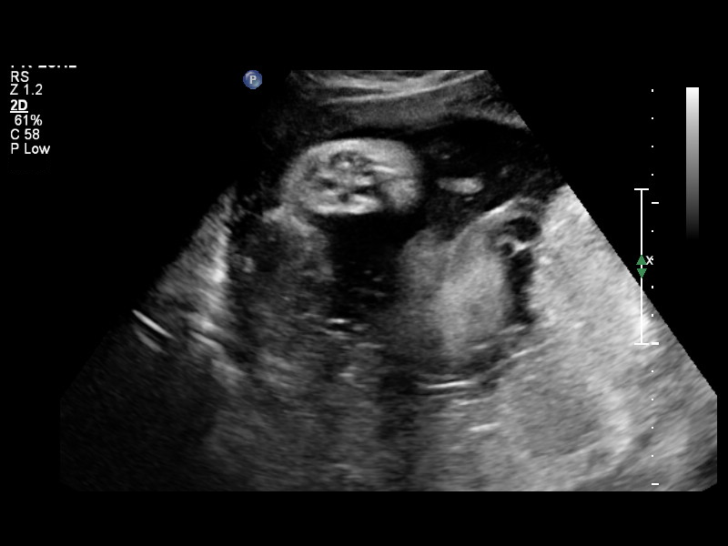
[im 30/43]
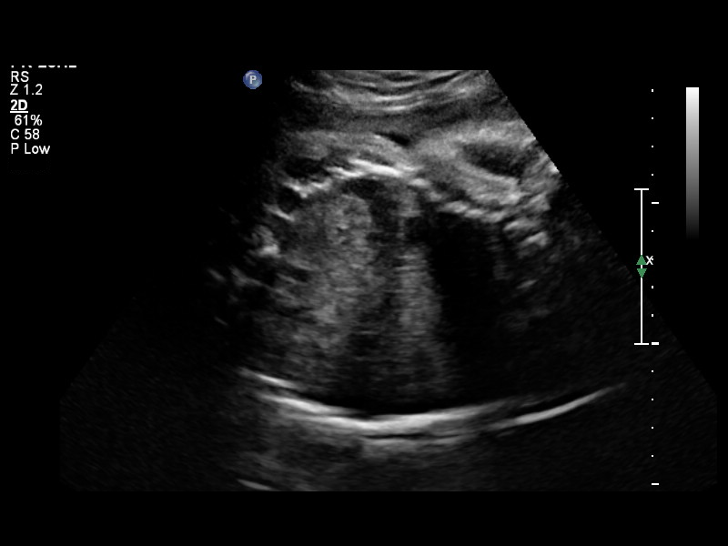
[im 35/43]
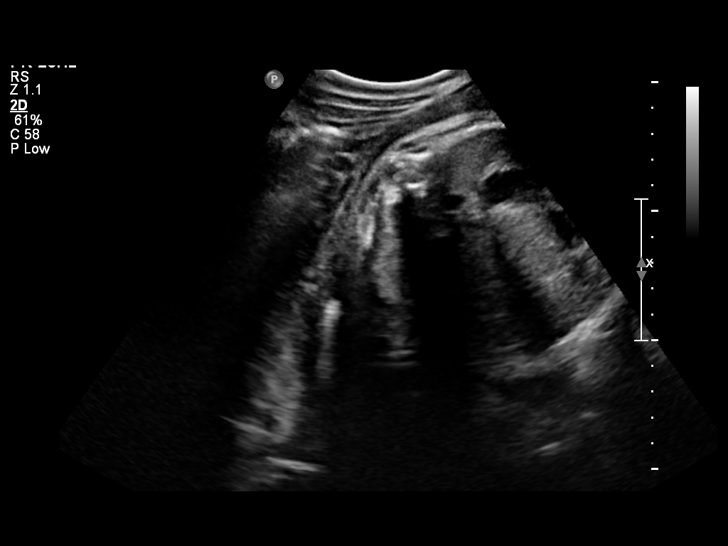
[im 38/43]
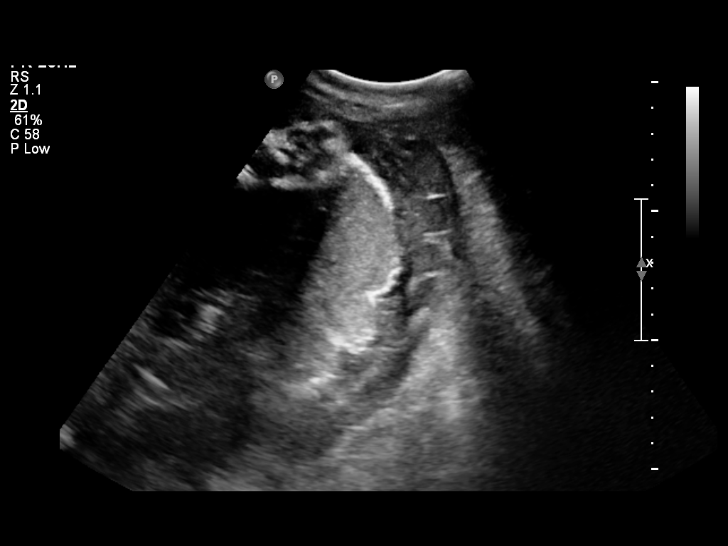
[im 41/43]
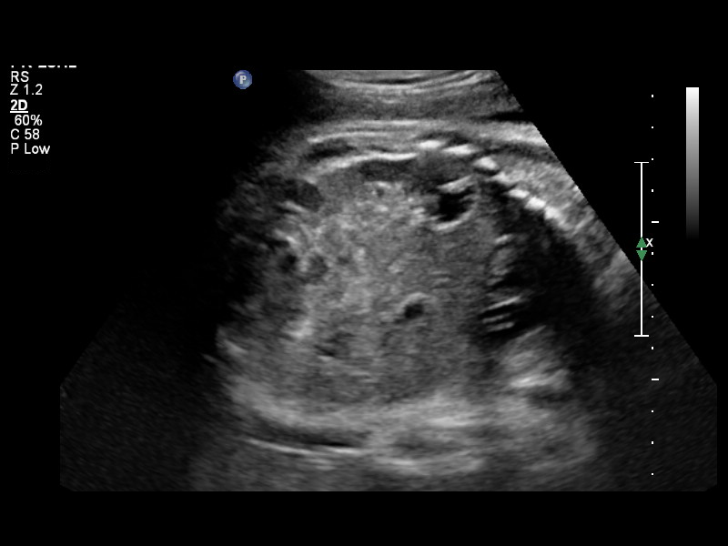

[12 of 28 positions shown; findings below may reference images not displayed]

OBSTETRICS REPORT
                      (Signed Final 05/19/2011 [DATE])

 Order#:         [id number]
                 87_O
Procedures

 US OB COMP + 14 WK                                    76805.1
Indications

 Assess amniotic fluid volume
 Assess Fetal Growth / Estimated Fetal Weight
Fetal Evaluation

 Fetal Heart Rate:  123                         bpm
 Cardiac Activity:  Observed
 Presentation:      Cephalic
 Placenta:          Posterior, above cervical
                    os
 P. Cord            Not well visualized
 Insertion:

 Amniotic Fluid
 AFI FV:      Subjectively within normal limits
 AFI Sum:     13.98   cm      53   %Tile     Larg Pckt:   7.35   cm
 RUQ:   2.24   cm    RLQ:    7.35   cm    LUQ:   2.72    cm   LLQ:    1.67   cm
Biophysical Evaluation

 Amniotic F.V:   Pocket => 2 cm two         F. Tone:        Observed
                 planes
 F. Movement:    Observed                   Score:          [DATE]
 F. Breathing:   Observed
Biometry

 BPD:     90.9  mm    G. Age:   36w 6d                CI:        74.96   70 - 86
                                                      FL/HC:      21.2   20.9 -

 HC:     333.1  mm    G. Age:   38w 0d       39  %    HC/AC:      1.01   0.92 -

 AC:     329.6  mm    G. Age:   36w 6d       46  %    FL/BPD:     77.8   71 - 87
 FL:      70.7  mm    G. Age:   36w 2d       20  %    FL/AC:      21.5   20 - 24
 Est. FW:    1362  gm    6 lb 11 oz      57  %
Gestational Age

 Clinical EDD:  37w 4d                                        EDD:   06/05/11
 U/S Today:     37w 0d                                        EDD:   06/09/11
 Best:          37w 4d    Det. By:   Clinical EDD             EDD:   06/05/11
Anatomy

 Cranium:           Appears normal      Aortic Arch:       Not well
                                                           visualized
 Fetal Cavum:       Appears normal      Ductal Arch:       Not well
                                                           visualized
 Ventricles:        Appears normal      Diaphragm:         Appears normal
 Choroid Plexus:    Not well            Stomach:           Appears
                    visualized                             normal, left
                                                           sided
 Cerebellum:        Not well            Abdomen:           Appears normal
                    visualized
 Posterior Fossa:   Not well            Abdominal Wall:    Not well
                    visualized                             visualized
 Nuchal Fold:       Not applicable      Cord Vessels:      Appears normal
                    (>20 wks GA)                           (3 vessel cord)
 Face:              Lips appear         Kidneys:           Appear normal
                    normal
 Heart:             Appears normal      Bladder:           Appears normal
                    (4 chamber &
                    axis)
 RVOT:              Not well            Spine:             Not well
                    visualized                             visualized
 LVOT:              Not well            Limbs:             Not well
                    visualized                             visualized

 Other:     Technically difficult due to advanced GA and fetal
            position.
Cervix Uterus Adnexa

 Cervix:       Not visualized (advanced GA >34 wks)

 Adnexa:     No abnormality visualized.
Impression

 Single living IUP with assigned GA of 37w 4d in cephalic
 position.
 Estimated fetal weight is at the 57th percentile for GA.
 Visualized fetal anatomy is normal.
 Normal amniotic fluid volume.
 BPP is [DATE].

 questions or concerns.

## 2014-03-23 ENCOUNTER — Encounter: Payer: Self-pay | Admitting: Emergency Medicine

## 2014-10-28 ENCOUNTER — Other Ambulatory Visit: Payer: Self-pay | Admitting: Family Medicine

## 2014-10-28 DIAGNOSIS — K689 Other disorders of retroperitoneum: Secondary | ICD-10-CM

## 2014-11-04 ENCOUNTER — Ambulatory Visit
Admission: RE | Admit: 2014-11-04 | Discharge: 2014-11-04 | Disposition: A | Discharge status: Home / Self Care | Payer: Non-veteran care | Source: Ambulatory Visit | Attending: Family Medicine | Admitting: Family Medicine

## 2014-11-04 DIAGNOSIS — K689 Other disorders of retroperitoneum: Secondary | ICD-10-CM

## 2014-11-04 DIAGNOSIS — N200 Calculus of kidney: Secondary | ICD-10-CM | POA: Insufficient documentation

## 2016-09-13 ENCOUNTER — Ambulatory Visit (INDEPENDENT_AMBULATORY_CARE_PROVIDER_SITE_OTHER): Payer: 59 | Admitting: Family Medicine

## 2016-09-13 ENCOUNTER — Encounter: Payer: Self-pay | Admitting: Family Medicine

## 2016-09-13 VITALS — BP 122/70 | HR 63 | Ht 62.0 in | Wt 163.4 lb

## 2016-09-13 DIAGNOSIS — R0789 Other chest pain: Secondary | ICD-10-CM

## 2016-09-13 LAB — CBC WITH DIFFERENTIAL/PLATELET
BASOS PCT: 0 %
Basophils Absolute: 0 cells/uL (ref 0–200)
EOS PCT: 1 %
Eosinophils Absolute: 69 cells/uL (ref 15–500)
HEMATOCRIT: 35 % (ref 35.0–45.0)
Hemoglobin: 11.6 g/dL — ABNORMAL LOW (ref 11.7–15.5)
Lymphocytes Relative: 33 %
Lymphs Abs: 2277 cells/uL (ref 850–3900)
MCH: 26.4 pg — ABNORMAL LOW (ref 27.0–33.0)
MCHC: 33.1 g/dL (ref 32.0–36.0)
MCV: 79.7 fL — ABNORMAL LOW (ref 80.0–100.0)
MONO ABS: 483 {cells}/uL (ref 200–950)
MPV: 10.5 fL (ref 7.5–12.5)
Monocytes Relative: 7 %
NEUTROS ABS: 4071 {cells}/uL (ref 1500–7800)
Neutrophils Relative %: 59 %
Platelets: 282 10*3/uL (ref 140–400)
RBC: 4.39 MIL/uL (ref 3.80–5.10)
RDW: 15.3 % — ABNORMAL HIGH (ref 11.0–15.0)
WBC: 6.9 10*3/uL (ref 4.0–10.5)

## 2016-09-13 LAB — COMPREHENSIVE METABOLIC PANEL
ALBUMIN: 4.1 g/dL (ref 3.6–5.1)
ALK PHOS: 68 U/L (ref 33–115)
ALT: 7 U/L (ref 6–29)
AST: 9 U/L — AB (ref 10–30)
BUN: 8 mg/dL (ref 7–25)
CALCIUM: 9.2 mg/dL (ref 8.6–10.2)
CO2: 24 mmol/L (ref 20–31)
Chloride: 106 mmol/L (ref 98–110)
Creat: 0.63 mg/dL (ref 0.50–1.10)
Glucose, Bld: 82 mg/dL (ref 65–99)
POTASSIUM: 4 mmol/L (ref 3.5–5.3)
SODIUM: 140 mmol/L (ref 135–146)
Total Bilirubin: 0.4 mg/dL (ref 0.2–1.2)
Total Protein: 6.7 g/dL (ref 6.1–8.1)

## 2016-09-13 LAB — LIPID PANEL
CHOLESTEROL: 199 mg/dL (ref <200)
HDL: 51 mg/dL (ref >50)
LDL Cholesterol: 134 mg/dL — ABNORMAL HIGH (ref <100)
Total CHOL/HDL Ratio: 3.9 Ratio (ref <5.0)
Triglycerides: 68 mg/dL (ref <150)
VLDL: 14 mg/dL (ref <30)

## 2016-09-13 LAB — TSH: TSH: 0.82 m[IU]/L

## 2016-09-13 MED ORDER — DEXLANSOPRAZOLE 60 MG PO CPDR: 60.0000 mg | DELAYED_RELEASE_CAPSULE | Freq: Every day | ORAL | 0 refills | Status: AC

## 2016-09-13 NOTE — Patient Instructions (Signed)
Avoid doing push ups or any exercises that strains your chest wall.   Start taking the Dexilant 30 minutes before breakfast daily.   Keep an eye on any triggers that makes your pain return or worse.   Follow up in 2 weeks.

## 2016-09-13 NOTE — Progress Notes (Signed)
Subjective:    Patient ID: Sherry Walls, female    DOB: 1975/11/06, 41 y.o.   MRN: [id number]  HPI Chief Complaint  Patient presents with  . new pt    new pt some chest discomfort. went to urgent care last month and had xray done and had some inflammation around her lungs- given antibitotics but not gotten better and had some dizziness with that.    She is new to the practice and here for an acute complaint.  [facility] [place] [facility].  Last CPE: years ago   Complains of diffuse anterior intermittent chest pain for the past 2 1/2 months. Reports a "cold or burning" sensation and occasionally a heaviness when she lays down. Lasts 2 hours to all day when it is present. She did have a cough initially and went to [facility] urgent care in [place] on March 13th with same complaints. She was told her symptoms were not related to her heart and were most likely related to an infectious etiology however her EKG and chest XR were both normal per records.  She took a course of steroids and states her symptoms went away but then returned approximately 2 weeks later.   States pain is not made worse with movement, inspiration, or activity.  Reports drinking cold or warm fluids actually improves her symptoms temporarily.   She has been doing a lot of push ups and chest exercises.   States her mother had a heart attack in her 58s with CABG.   Denies fever, chills, palpitations, shortness of breath, orthopnea, DOE,  abdominal pain, nausea, vomiting, diarrhea, urinary symptoms.  Denies cough.  Denies history of allergies or reflux.  Denise calf pain, no recent surgery, long airplane or car rides. No history of DVT or PE.   States she felt dizzy last week for a few seconds. Only had one episode. One episode of right arm numbness and tingling a few days ago. Neither symptom has returned.   Other providers: none   Past medical history: nothing significant.  Surgeries: C- section.   Has 3 kids.  LMP: 09/01/2016  Social history: Lives with 3 kids, divorced, works as a Land.    Reviewed allergies, medications, past medical, surgical, family, and social history.    Review of Systems Pertinent positives and negatives in the history of present illness.     Objective:   Physical Exam  Constitutional: She is oriented to person, place, and time. She appears well-developed and well-nourished. No distress.  HENT:  Nose: Nose normal.  Mouth/Throat: Uvula is midline, oropharynx is clear and moist and mucous membranes are normal.  Eyes: Conjunctivae and lids are normal. Pupils are equal, round, and reactive to light. No scleral icterus.  Neck: Trachea normal and normal range of motion. Neck supple. No JVD present.  Cardiovascular: Normal rate, regular rhythm, normal heart sounds and normal pulses.  Exam reveals no gallop and no friction rub.   No murmur heard. No LE edema   Pulmonary/Chest: Effort normal and breath sounds normal. She exhibits no tenderness.  Abdominal: Soft. Normal appearance. There is no tenderness.  Lymphadenopathy:    She has no cervical adenopathy.       Right: No supraclavicular adenopathy present.       Left: No supraclavicular adenopathy present.  Neurological: She is alert and oriented to person, place, and time. She has normal strength. No cranial nerve deficit or sensory deficit. Coordination and gait normal.  Skin:  Skin is warm and dry. No rash noted. She is not diaphoretic. No pallor.  Psychiatric: She has a normal mood and affect. Her speech is normal and behavior is normal. Thought content normal.   BP 122/70   Pulse 63   Ht  (1.575 m)   Wt 163 lb 6.4 oz (74.1 kg)   LMP 09/01/2016   SpO2 98%   BMI 29.89 kg/m      Assessment & Plan:  Chest pain, atypical - Plan: CBC with Differential/Platelet, Comprehensive metabolic panel, TSH, EKG 12-Lead, CANCELED: Lipid panel  Reviewed notes, EKG and chest XR result from  her recent visit to the UC for the same complaint.  Discussed that her symptoms do not appear to be related to cardiopulmonary etiology. Suspect that she may have costochondritis even though she is not point tender or pain is not worsened with movement. Pain improving with steroids speaks to this. Plan to have her stop lifting weights and doing push ups or any exercises that strains her chest wall and see if this helps.  Very unlikely this could be a PE but this was considered.  Also discussed possible GERD since she notes improvement with cold or warm fluids. Will give a trial of Dexilant and have her follow up in 2 weeks.  Her EKG is unremarkable but based on her mother's history she will need a cardiology referral in the near future to rule out underlying cardiac issues.

## 2016-09-26 ENCOUNTER — Encounter: Payer: Self-pay | Admitting: Family Medicine

## 2016-09-29 ENCOUNTER — Ambulatory Visit (INDEPENDENT_AMBULATORY_CARE_PROVIDER_SITE_OTHER): Payer: 59 | Admitting: Family Medicine

## 2016-09-29 ENCOUNTER — Encounter: Payer: Self-pay | Admitting: Family Medicine

## 2016-09-29 VITALS — BP 130/80 | HR 71 | Wt 161.8 lb

## 2016-09-29 DIAGNOSIS — R0789 Other chest pain: Secondary | ICD-10-CM | POA: Diagnosis not present

## 2016-09-29 NOTE — Progress Notes (Signed)
   Subjective:    Patient ID: Sherry Walls, female    DOB: Jul 17, 1975, 41 y.o.   MRN: [id number]  HPI Chief Complaint  Patient presents with  . 2 week follow-up    2 week follow-up on Acid reflux. feeling much better   She is here for a follow up on atypical chest pain. States she only had one brief episode of chest pain since our last visit and this was when she was upset and stressed. It went away when she calmed down. States she has felt much better. No concerns or complaints today.  She has been taking Dexilant and is not sure if this helped her symptoms or not.  States she is relieved that chest pain has resolved for the most part.   Denies fever, chills, dizziness, palpitations, shortness of breath, cough, abdominal pain.     Review of Systems Pertinent positives and negatives in the history of present illness.     Objective:   Physical Exam BP 130/80   Pulse 71   Wt 161 lb 12.8 oz (73.4 kg)   LMP 09/01/2016   BMI 29.59 kg/m   Alert and oriented and in no acute distress. Not otherwise examined.       Assessment & Plan:  Atypical chest pain  Discussed that since her symptoms have resolved that this may have been related to reflux or a MSK issue. She is no longer taking PPI and discussed that if her symptoms return then she may want to try medication again for reflux and see if this helps.  She will let me know if symptoms return or any new concerns. Follow up as needed.

## 2016-12-08 LAB — HM HIV SCREENING LAB: HM HIV Screening: NEGATIVE

## 2018-11-01 LAB — HM PAP SMEAR: HM Pap smear: NEGATIVE

## 2018-12-10 ENCOUNTER — Ambulatory Visit: Payer: Self-pay
